# Patient Record
Sex: Female | Born: 2000 | Race: White | Hispanic: No | Marital: Single | Smoking: Never smoker
Health system: Southern US, Community
[De-identification: ages and names within clinical notes are randomized; demographics above are authoritative.]

## PROBLEM LIST (undated history)

## (undated) DIAGNOSIS — T7840XA Allergy, unspecified, initial encounter: Secondary | ICD-10-CM

## (undated) DIAGNOSIS — Z23 Encounter for immunization: Secondary | ICD-10-CM

## (undated) DIAGNOSIS — Z00129 Encounter for routine child health examination without abnormal findings: Secondary | ICD-10-CM

## (undated) DIAGNOSIS — R011 Cardiac murmur, unspecified: Secondary | ICD-10-CM

## (undated) HISTORY — DX: Encounter for routine child health examination without abnormal findings: Z00.129

## (undated) HISTORY — DX: Allergy, unspecified, initial encounter: T78.40XA

## (undated) HISTORY — DX: Cardiac murmur, unspecified: R01.1

## (undated) HISTORY — DX: Encounter for immunization: Z23

---
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
## 2024-04-06 NOTE — Progress Notes (Unsigned)
  [facility] [address] [place] [address] Dept: [phone] Dept Fax: [phone]  New Patient Office Visit  Subjective:   Destiny Meadows [birth year] 04/07/2024  No chief complaint on file.   HPI: Destiny Meadows presents today to establish care at [facility]. Introduced to Publishing rights manager role and practice setting.  All questions answered.  Concerns: See below       The following portions of the patient's history were reviewed and updated as appropriate: past medical history, past surgical history, family history, social history, allergies, medications, and problem list.   Patient Active Problem List   Diagnosis Date Noted   Skin mole 11/11/2019   Heart murmur, systolic 09/15/2014   Allergy 69/62/9528   Need for viral immunization 08/24/2014   WCC (well child check) 08/23/2014   BMI (body mass index), pediatric, 5% to less than 85% for age 91/30/2015   Past Medical History:  Diagnosis Date   Allergic state 08/24/2014   Heart murmur, systolic 09/15/2014   Need for viral immunization 08/24/2014   WCC (well child check) 08/23/2014   No past surgical history on file. Family History  Problem Relation Age of Onset   Hyperlipidemia Mother    Other Maternal Grandfather        ruptured mitral valve   Heart attack Paternal Grandfather        quadruple bipass   No current outpatient medications on file. No Known Allergies  ROS: A complete ROS was performed with pertinent positives/negatives noted in the HPI. The remainder of the ROS are negative.   Objective:   There were no vitals filed for this visit.  GENERAL: Well-appearing, in NAD. Well nourished.  SKIN: Pink, warm and dry. No rash, lesion, ulceration, or ecchymoses.  NECK: Trachea midline. Full ROM w/o pain or tenderness. No lymphadenopathy.  RESPIRATORY: Chest wall symmetrical. Respirations even and non-labored.  Breath sounds clear to auscultation bilaterally.  CARDIAC: S1, S2 present, regular rate and rhythm. Peripheral pulses 2+ bilaterally.  MSK: Muscle tone and strength appropriate for age. Joints w/o tenderness, redness, or swelling.  EXTREMITIES: Without clubbing, cyanosis, or edema.  NEUROLOGIC: No motor or sensory deficits. Steady, even gait.  PSYCH/MENTAL STATUS: Alert, oriented x 3. Cooperative, appropriate mood and affect.   Health Maintenance Due  Topic Date Due   HIV Screening  Never done   Hepatitis C Screening  Never done   Cervical Cancer Screening (Pap smear)  Never done   DTaP/Tdap/Td (7 - Td or Tdap) 05/22/2023   COVID-19 Vaccine (1 - 2024-25 season) Never done    No results found for any visits on 04/07/24.  Assessment & Plan:   No orders of the defined types were placed in this encounter.  No orders of the defined types were placed in this encounter.   No follow-ups on file.   Gavin Kast, FNP

## 2024-04-07 ENCOUNTER — Encounter: Payer: Self-pay | Admitting: Internal Medicine

## 2024-04-07 ENCOUNTER — Ambulatory Visit: Payer: Self-pay | Admitting: Internal Medicine

## 2024-04-07 DIAGNOSIS — Z124 Encounter for screening for malignant neoplasm of cervix: Secondary | ICD-10-CM

## 2024-06-07 ENCOUNTER — Encounter: Payer: Self-pay | Admitting: Internal Medicine

## 2024-06-07 ENCOUNTER — Ambulatory Visit (INDEPENDENT_AMBULATORY_CARE_PROVIDER_SITE_OTHER): Admitting: Internal Medicine

## 2024-06-07 VITALS — BP 100/60 | HR 90 | Temp 98.9°F | Ht 64.4 in | Wt 126.8 lb

## 2024-06-07 DIAGNOSIS — Z0000 Encounter for general adult medical examination without abnormal findings: Secondary | ICD-10-CM | POA: Diagnosis not present

## 2024-06-07 NOTE — Progress Notes (Signed)
 Subjective:   Destiny Meadows 01/11/2001  06/07/2024   CC: Chief Complaint  Patient presents with   Annual Exam    Not fasting     HPI: Destiny Meadows is a 23 y.o. female who presents for a routine health maintenance exam.  Labs not collected at time of visit. Patient is not fasting. She will return for fasting lab work.    HEALTH SCREENINGS: - Pap smear: declined - Mammogram (40+): Not applicable  - Colonoscopy (45+): Not applicable  - Bone Density (65+): Not applicable  - Lung CA screening with low-dose CT:  Not applicable Adults age 81-80 who are current cigarette smokers or quit within the last 15 years. Must have 20 pack year history.   Depression and Anxiety Screen done today and results listed below:     06/07/2024    4:00 PM 04/07/2024    1:50 PM 11/11/2019    8:22 AM 05/24/2014   12:14 PM  Depression screen PHQ 2/9  Decreased Interest 0 1 0 0  Down, Depressed, Hopeless 1 0 0 0  PHQ - 2 Score 1 1 0 0  Altered sleeping 0 0  0  Tired, decreased energy 1 1  0  Change in appetite 0 0  0  Feeling bad or failure about yourself  0 1  0  Trouble concentrating 0 0  0  Moving slowly or fidgety/restless 0 0  0  Suicidal thoughts 0 0  0   PHQ-9 Score 2 3  0  Difficult doing work/chores Not difficult at all Not difficult at all       Data saved with a previous flowsheet row definition      06/07/2024    4:00 PM 04/07/2024    1:51 PM  GAD 7 : Generalized Anxiety Score  Nervous, Anxious, on Edge 1 1  Control/stop worrying 0 1  Worry too much - different things 0 1  Trouble relaxing 0 1  Restless 0 0  Easily annoyed or irritable 0 0  Afraid - awful might happen 0 1  Total GAD 7 Score 1 5  Anxiety Difficulty Not difficult at all Not difficult at all    IMMUNIZATIONS: - Tdap: Tetanus vaccination status reviewed: declined at this time. - HPV: Up to date - Influenza: Postponed to flu season    Past medical history, surgical history, medications, allergies,  family history and social history reviewed with patient today and changes made to appropriate areas of the chart.   Past Medical History:  Diagnosis Date   Allergic state 08/24/2014   Heart murmur, systolic 09/15/2014   Need for viral immunization 08/24/2014   WCC (well child check) 08/23/2014    History reviewed. No pertinent surgical history.  No current outpatient medications on file prior to visit.   No current facility-administered medications on file prior to visit.    No Known Allergies   Social History   Socioeconomic History   Marital status: Single    Spouse name: Not on file   Number of children: Not on file   Years of education: Not on file   Highest education level: Bachelor's degree (e.g., BA, AB, BS)  Occupational History   Not on file  Tobacco Use   Smoking status: Never   Smokeless tobacco: Never  Substance and Sexual Activity   Alcohol use: Not Currently   Drug use: No   Sexual activity: Not Currently    Birth control/protection: Abstinence  Other Topics Concern  Not on file  Social History Narrative   Not on file   Social Drivers of Health   Financial Resource Strain: Low Risk  (06/05/2024)   Overall Financial Resource Strain (CARDIA)    Difficulty of Paying Living Expenses: Not very hard  Food Insecurity: No Food Insecurity (06/05/2024)   Hunger Vital Sign    Worried About Running Out of Food in the Last Year: Never true    Ran Out of Food in the Last Year: Never true  Transportation Needs: No Transportation Needs (06/05/2024)   PRAPARE - Administrator, Civil Service (Medical): No    Lack of Transportation (Non-Medical): No  Physical Activity: Sufficiently Active (06/05/2024)   Exercise Vital Sign    Days of Exercise per Week: 4 days    Minutes of Exercise per Session: 60 min  Stress: No Stress Concern Present (06/05/2024)   [employer] of Occupational Health - Occupational Stress Questionnaire    Feeling of Stress: Only a  little  Recent Concern: Stress - Stress Concern Present (04/06/2024)   [employer] of Occupational Health - Occupational Stress Questionnaire    Feeling of Stress : To some extent  Social Connections: Socially Isolated (06/05/2024)   Social Connection and Isolation Panel    Frequency of Communication with Friends and Family: Once a week    Frequency of Social Gatherings with Friends and Family: Once a week    Attends Religious Services: 1 to 4 times per year    Active Member of [employer] or Organizations: No    Attends Engineer, structural: Not on file    Marital Status: Never married  Catering manager Violence: Not on file   Social History   Tobacco Use  Smoking Status Never  Smokeless Tobacco Never   Social History   Substance and Sexual Activity  Alcohol Use Not Currently    Family History  Problem Relation Age of Onset   Hyperlipidemia Mother    Depression Sister    Other Maternal Grandfather        ruptured mitral valve   Heart attack Paternal Grandfather        quadruple bipass   Stroke Paternal Grandfather      ROS: Denies fever, fatigue, unexplained weight loss/gain, hearing or vision changes, cardiac or respiratory complaints. Denies neurological deficits, musculoskeletal complaints, gastrointestinal or genitourinary complaints, mental health complaints, and skin changes.   Objective:   Today's Vitals   06/07/24 1539  BP: 100/60  Pulse: 90  Temp: 98.9 F (37.2 C)  TempSrc: Temporal  SpO2: 98%  Weight: 126 lb 12.8 oz (57.5 kg)  Height: 5' 4.4 (1.636 m)    GENERAL APPEARANCE: Well-appearing, in NAD. Well nourished.  SKIN: Pink, warm and dry. Turgor normal. No rash, lesion, ulceration, or ecchymoses. Hair evenly distributed.  HEENT: HEAD: Normocephalic.  EYES: PERRLA. EOMI. Lids intact w/o defect. Sclera white, Conjunctiva pink w/o exudate.  EARS: External ear w/o redness, swelling, masses or lesions. EAC clear. TM's intact, translucent w/o  bulging, appropriate landmarks visualized. Appropriate acuity to conversational tones.  NOSE: Septum midline w/o deformity. Nares patent, mucosa pink and non-inflamed w/o drainage.  THROAT: Uvula midline. Oropharynx clear. Tonsils non-inflamed w/o exudate. Oral mucosa pink and moist.  NECK: Supple, Trachea midline. Full ROM w/o pain or tenderness. No lymphadenopathy. Thyroid non-tender w/o enlargement or palpable masses.  BREASTS: Breasts pendulous, symmetrical, and w/o palpable masses. Nipples everted and w/o discharge. No rash or skin retraction. No axillary or supraclavicular lymphadenopathy.  RESPIRATORY:  Chest wall symmetrical w/o masses. Respirations even and non-labored. Breath sounds clear to auscultation bilaterally. No wheezes, rales, rhonchi, or crackles. CARDIAC: S1, S2 present, regular rate and rhythm. No gallops, murmurs, rubs, or clicks.  Capillary refill <2 seconds. Peripheral pulses 2+ bilaterally. GI: Abdomen soft w/o distention. Normoactive bowel sounds. No palpable masses or tenderness. No guarding or rebound tenderness. Liver and spleen w/o tenderness or enlargement. No CVA tenderness.  MSK: Muscle tone and strength appropriate for age, w/o atrophy or abnormal movement.  EXTREMITIES: Active ROM intact, w/o tenderness, crepitus, or contracture. No obvious joint deformities or effusions. No clubbing, edema, or cyanosis.  NEUROLOGIC: CN's II-XII intact. Motor strength symmetrical with no obvious weakness. No sensory deficits. Steady, even gait.  PSYCH/MENTAL STATUS: Alert, oriented x 3. Cooperative, appropriate mood and affect.     Assessment & Plan:  Encounter for general adult medical examination without abnormal findings -     CBC with Differential/Platelet; Future -     Comprehensive metabolic panel with GFR; Future -     Lipid panel; Future -     TSH; Future    Orders Placed This Encounter  Procedures   CBC with Differential/Platelet    Standing Status:   Future     Expiration Date:   12/08/2024   Comprehensive metabolic panel with GFR    Standing Status:   Future    Expiration Date:   12/08/2024   Lipid panel    Standing Status:   Future    Expiration Date:   12/08/2024   TSH    Standing Status:   Future    Expiration Date:   12/08/2024    PATIENT COUNSELING:  - Encouraged a healthy well-balanced diet. Patient may adjust caloric intake to maintain or achieve ideal body weight. May reduce intake of dietary saturated fat and total fat and have adequate dietary potassium and calcium preferably from fresh fruits, vegetables, and low-fat dairy products.   - Advised to avoid cigarette smoking. - Discussed with the patient that most people either abstain from alcohol or drink within safe limits (<=14/week and <=4 drinks/occasion for males, <=7/weeks and <= 3 drinks/occasion for females) and that the risk for alcohol disorders and other health effects rises proportionally with the number of drinks per week and how often a drinker exceeds daily limits. - Discussed cessation/primary prevention of drug use and availability of treatment for abuse.  - Discussed sexually transmitted diseases, avoidance of unintended pregnancy and contraceptive alternatives.  - Stressed the importance of regular exercise - Injury prevention: Discussed safety belts, safety helmets, smoke detector, smoking near bedding or upholstery.  - Dental health: Discussed importance of regular tooth brushing, flossing, and dental visits.   NEXT PREVENTATIVE PHYSICAL DUE IN 1 YEAR.  Return in about 1 year (around 06/07/2025) for Annual Physical Exam with fasting lab work.  Rosina Senters, FNP

## 2024-06-07 NOTE — Patient Instructions (Addendum)
 Schedule fasting lab work appt at check out.   Schedule pap smear
